# Patient Record
Sex: Female | Born: 1970 | Race: White | Hispanic: No | Marital: Married
Health system: Southern US, Community
[De-identification: ages and names within clinical notes are randomized; demographics above are authoritative.]

---

## 1998-02-09 ENCOUNTER — Ambulatory Visit (HOSPITAL_COMMUNITY): Admission: RE | Admit: 1998-02-09 | Discharge: 1998-02-09 | Payer: Self-pay | Admitting: Obstetrics and Gynecology

## 1998-03-01 ENCOUNTER — Other Ambulatory Visit: Admission: RE | Admit: 1998-03-01 | Discharge: 1998-03-01 | Payer: Self-pay | Admitting: Obstetrics and Gynecology

## 1998-04-12 ENCOUNTER — Ambulatory Visit (HOSPITAL_COMMUNITY): Admission: RE | Admit: 1998-04-12 | Discharge: 1998-04-12 | Payer: Self-pay | Admitting: Obstetrics and Gynecology

## 1998-06-18 ENCOUNTER — Other Ambulatory Visit: Admission: RE | Admit: 1998-06-18 | Discharge: 1998-06-18 | Payer: Self-pay | Admitting: Obstetrics and Gynecology

## 1998-08-26 ENCOUNTER — Inpatient Hospital Stay (HOSPITAL_COMMUNITY): Admission: AD | Admit: 1998-08-26 | Discharge: 1998-08-29 | Payer: Self-pay | Admitting: Obstetrics & Gynecology

## 2001-09-16 ENCOUNTER — Other Ambulatory Visit: Admission: RE | Admit: 2001-09-16 | Discharge: 2001-09-16 | Payer: Self-pay | Admitting: Obstetrics and Gynecology

## 2001-11-30 ENCOUNTER — Encounter: Payer: Self-pay | Admitting: Obstetrics and Gynecology

## 2001-11-30 ENCOUNTER — Ambulatory Visit (HOSPITAL_COMMUNITY): Admission: RE | Admit: 2001-11-30 | Discharge: 2001-11-30 | Payer: Self-pay | Admitting: Obstetrics and Gynecology

## 2002-03-30 ENCOUNTER — Inpatient Hospital Stay (HOSPITAL_COMMUNITY): Admission: AD | Admit: 2002-03-30 | Discharge: 2002-03-30 | Payer: Self-pay | Admitting: Obstetrics and Gynecology

## 2002-03-30 ENCOUNTER — Inpatient Hospital Stay (HOSPITAL_COMMUNITY): Admission: AD | Admit: 2002-03-30 | Discharge: 2002-04-01 | Payer: Self-pay | Admitting: Obstetrics and Gynecology

## 2006-02-09 ENCOUNTER — Inpatient Hospital Stay (HOSPITAL_COMMUNITY): Admission: AD | Admit: 2006-02-09 | Discharge: 2006-02-11 | Payer: Self-pay | Admitting: Obstetrics and Gynecology

## 2018-01-19 ENCOUNTER — Other Ambulatory Visit: Payer: Self-pay | Admitting: Obstetrics and Gynecology

## 2018-01-19 DIAGNOSIS — N6320 Unspecified lump in the left breast, unspecified quadrant: Secondary | ICD-10-CM

## 2018-01-22 ENCOUNTER — Ambulatory Visit
Admission: RE | Admit: 2018-01-22 | Discharge: 2018-01-22 | Disposition: A | Discharge status: Home / Self Care | Payer: BLUE CROSS/BLUE SHIELD | Source: Ambulatory Visit | Attending: Obstetrics and Gynecology | Admitting: Obstetrics and Gynecology

## 2018-01-22 DIAGNOSIS — N6320 Unspecified lump in the left breast, unspecified quadrant: Secondary | ICD-10-CM

## 2019-05-26 ENCOUNTER — Other Ambulatory Visit: Payer: Self-pay | Admitting: Obstetrics and Gynecology

## 2019-05-26 DIAGNOSIS — N6310 Unspecified lump in the right breast, unspecified quadrant: Secondary | ICD-10-CM

## 2019-06-02 ENCOUNTER — Ambulatory Visit
Admission: RE | Admit: 2019-06-02 | Discharge: 2019-06-02 | Disposition: A | Discharge status: Home / Self Care | Payer: BC Managed Care – PPO | Source: Ambulatory Visit | Attending: Obstetrics and Gynecology | Admitting: Obstetrics and Gynecology

## 2019-06-02 ENCOUNTER — Ambulatory Visit
Admission: RE | Admit: 2019-06-02 | Discharge: 2019-06-02 | Disposition: A | Discharge status: Home / Self Care | Payer: BLUE CROSS/BLUE SHIELD | Source: Ambulatory Visit | Attending: Obstetrics and Gynecology | Admitting: Obstetrics and Gynecology

## 2019-06-02 ENCOUNTER — Other Ambulatory Visit: Payer: Self-pay

## 2019-06-02 DIAGNOSIS — N6310 Unspecified lump in the right breast, unspecified quadrant: Secondary | ICD-10-CM

## 2019-12-25 IMAGING — MG DIGITAL DIAGNOSTIC UNILATERAL RIGHT MAMMOGRAM WITH TOMO AND CAD
4 series · 4 of 12 positions shown · non-contrast
Comparison: Previous exam(s).

CLINICAL DATA: Possible right breast mass seen on most recent
screening mammography.

EXAM:
DIGITAL DIAGNOSTIC RIGHT MAMMOGRAM WITH CAD AND TOMO
ULTRASOUND RIGHT BREAST

[R CC synth-2D]
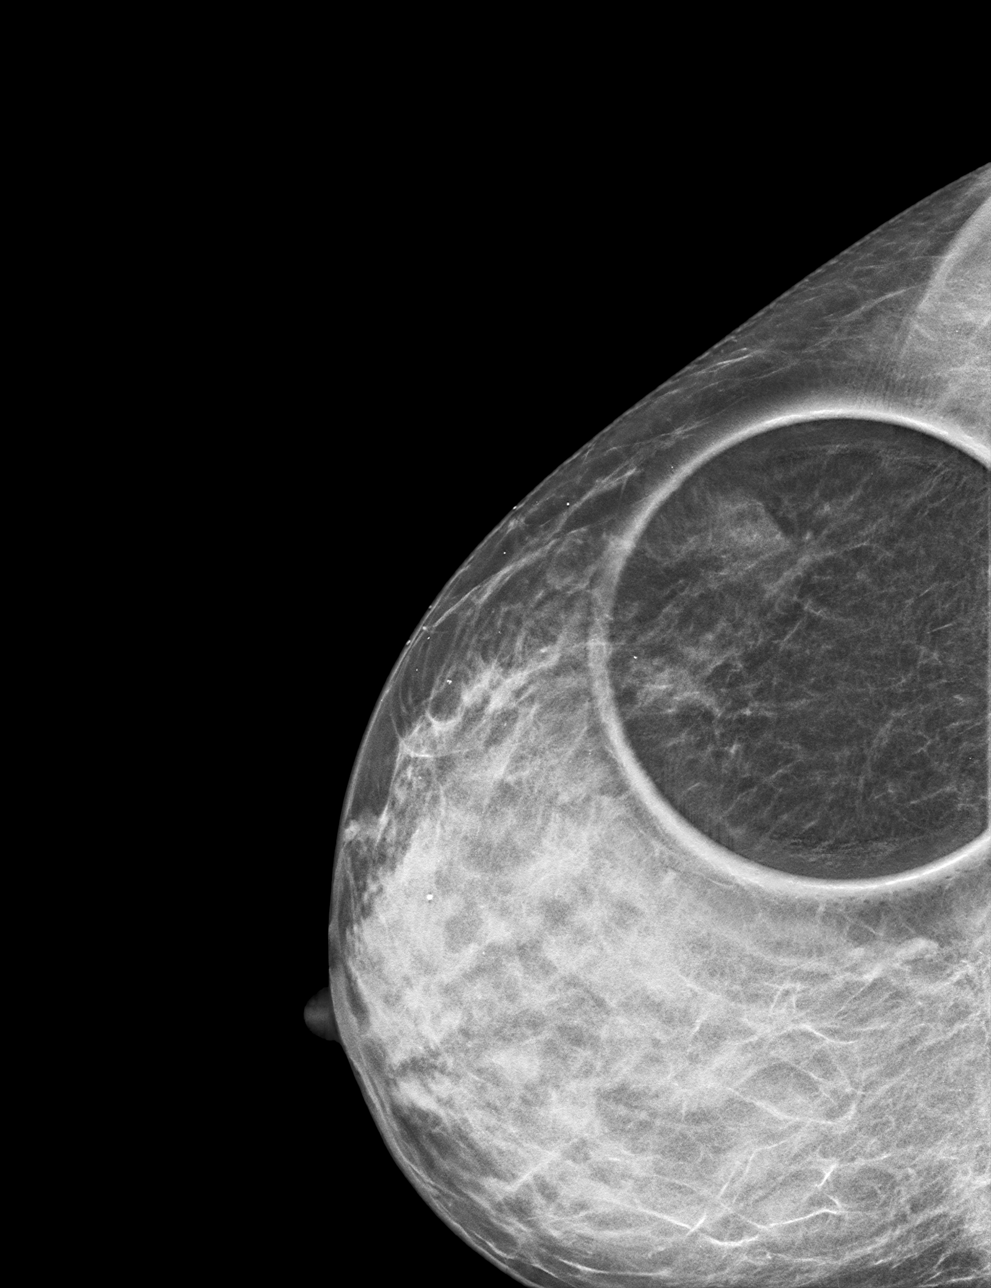

[R MLO synth-2D]
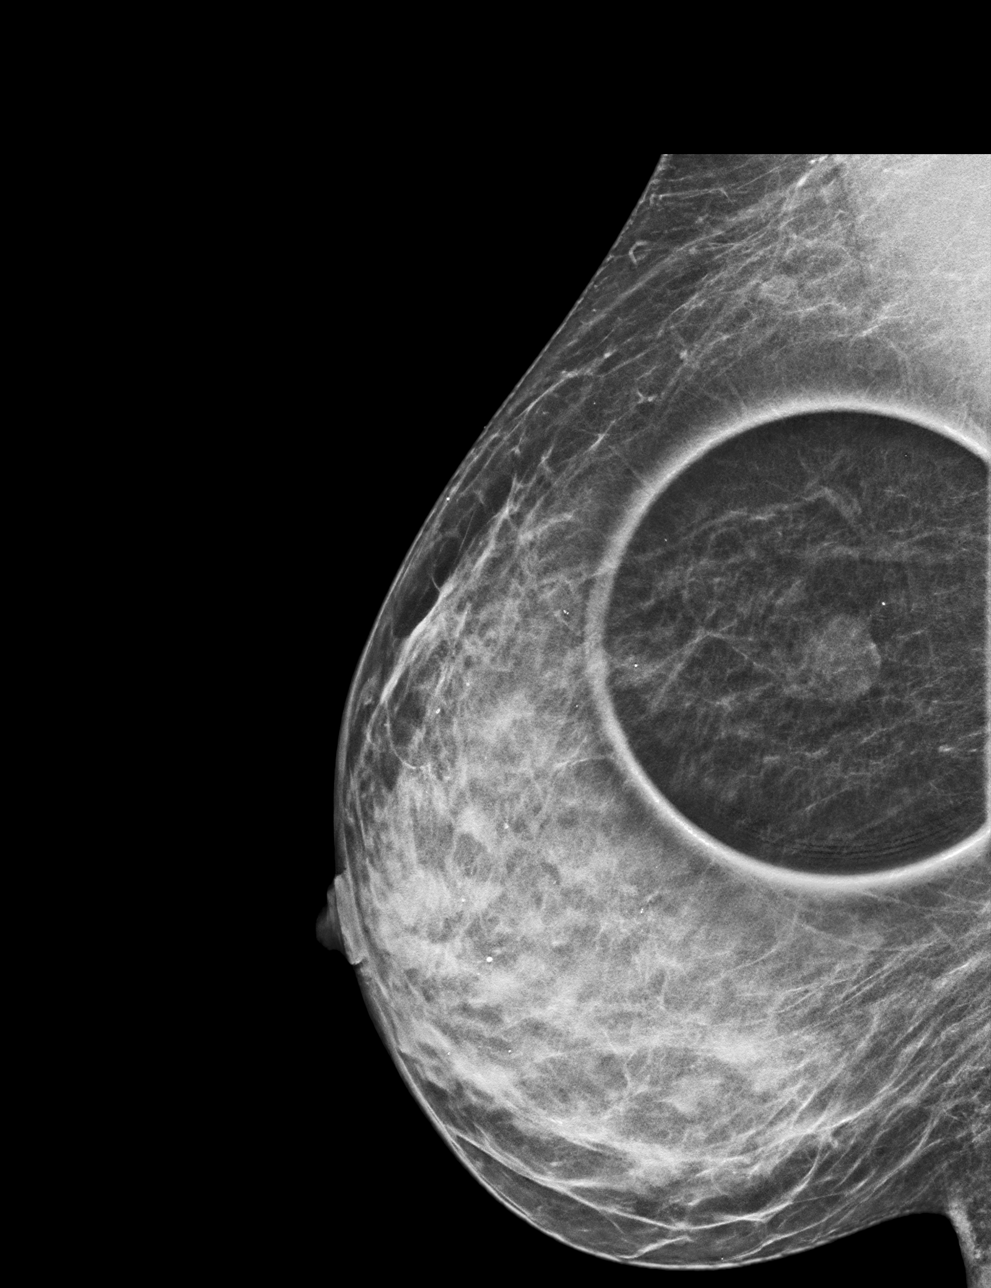

[R MLO tomo · tomo slice 23/45.0]
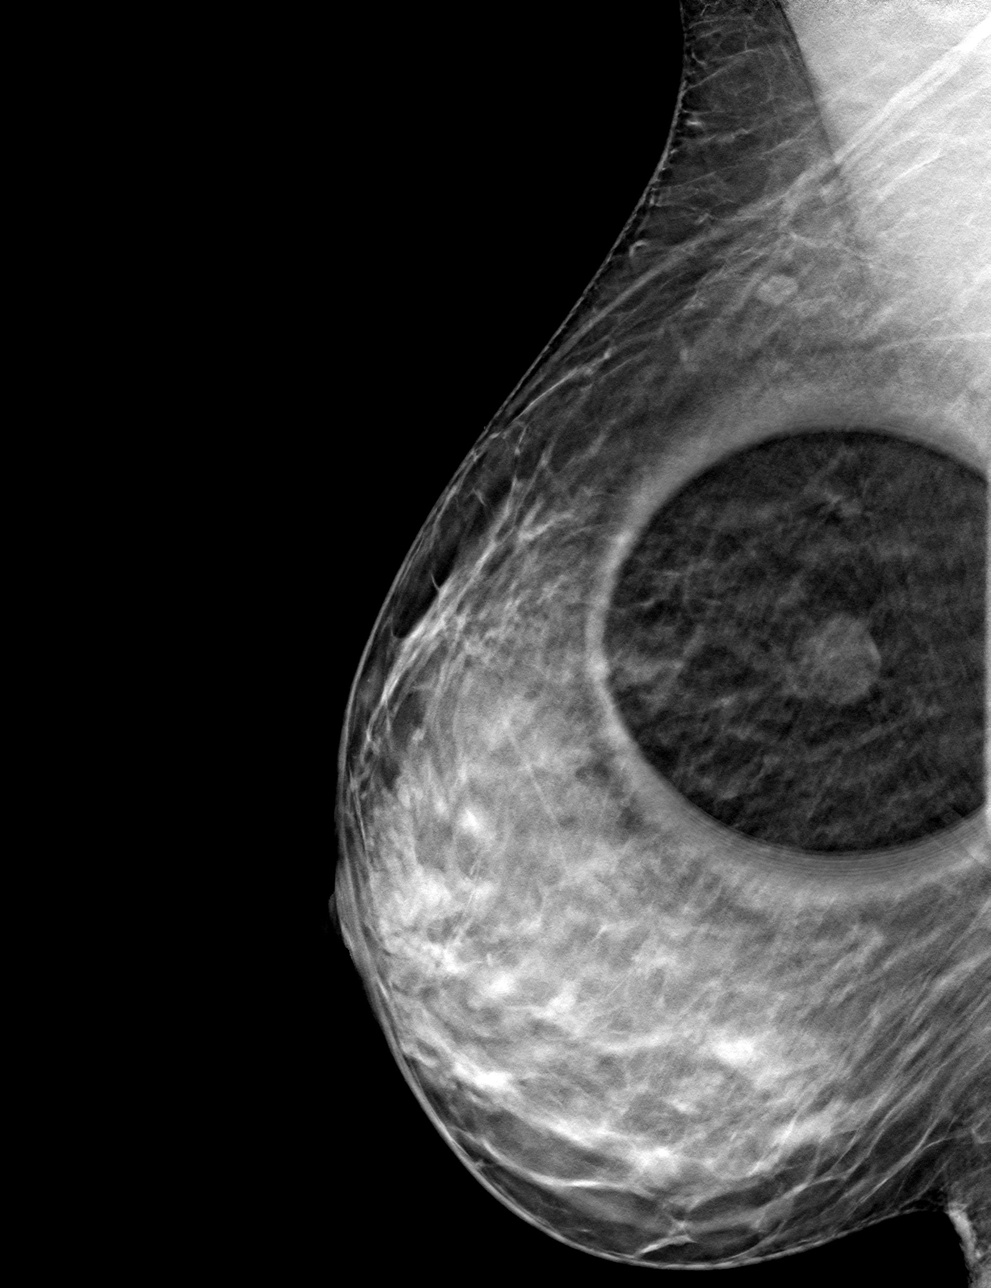

[R CC tomo · tomo slice 22/43.0]
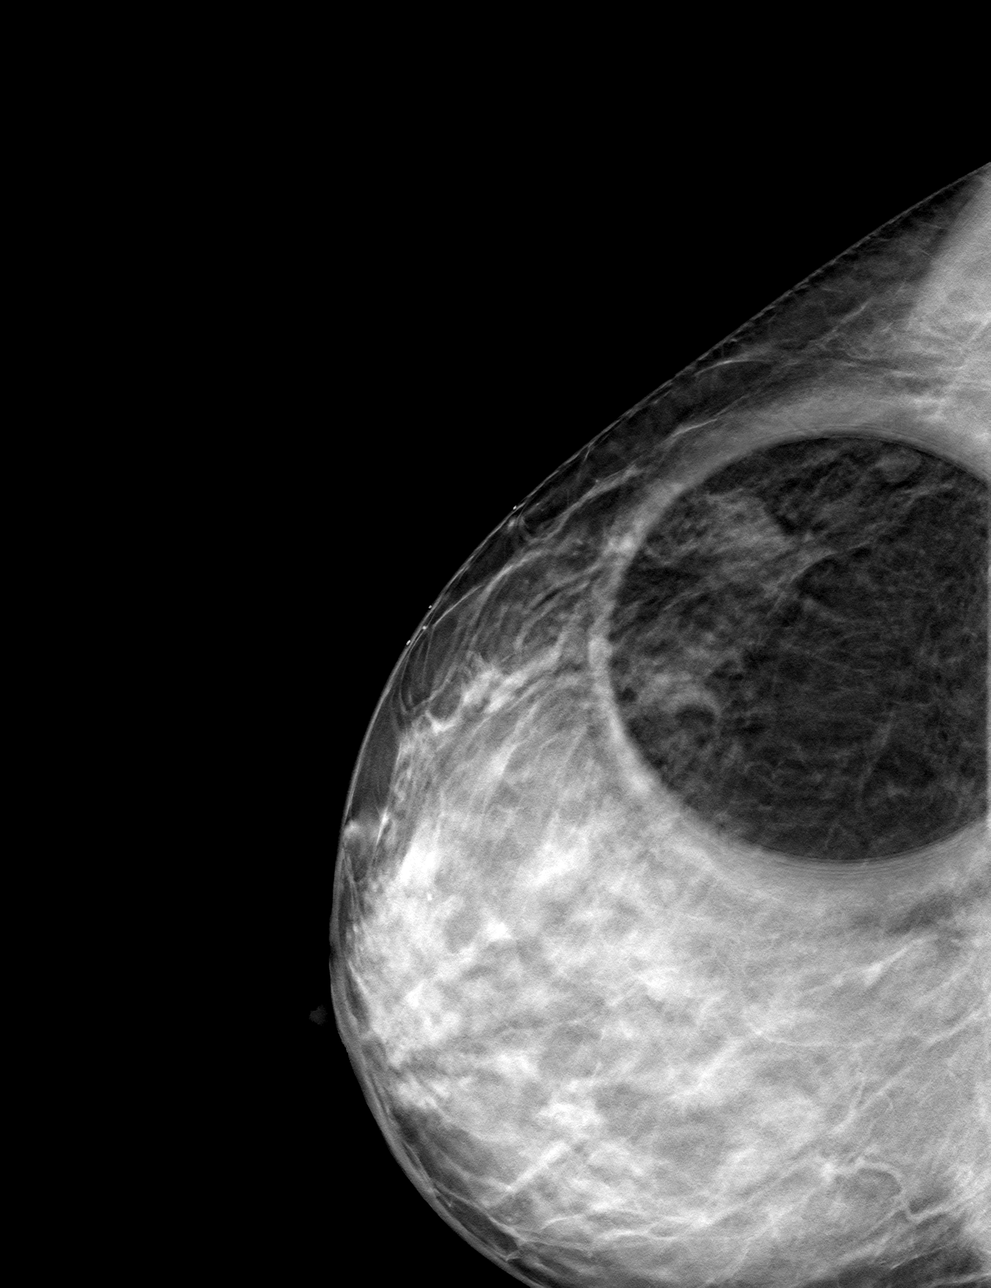

[4 of 12 positions shown; findings below may reference images not displayed]

ACR Breast Density Category c: The breast tissue is heterogeneously
dense, which may obscure small masses.
FINDINGS: Additional mammographic views of the right breast demonstrate
circumscribed 1.4 cm mass in the right breast slightly upper outer
quadrant, posterior depth.

Mammographic images were processed with CAD.

Targeted ultrasound is performed, showing right breast 9:30 o'clock
8 cm from the nipple benign-appearing cyst measuring 1.3 x 1.5 x
cm. This finding corresponds to the mammographically seen mass.
IMPRESSION: Right breast 9:30 o'clock benign-appearing cyst.

No mammographic evidence of malignancy in the right breast.

RECOMMENDATION:
Screening mammogram in one year.(Code:[id number])

I have discussed the findings and recommendations with the patient.
Results were also provided in writing at the conclusion of the
visit. If applicable, a reminder letter will be sent to the patient
regarding the next appointment.

BI-RADS CATEGORY  2: Benign.

## 2023-07-12 ENCOUNTER — Other Ambulatory Visit: Payer: Self-pay

## 2023-07-12 ENCOUNTER — Emergency Department (HOSPITAL_COMMUNITY)
Admission: EM | Admit: 2023-07-12 | Discharge: 2023-07-12 | Disposition: A | Discharge status: Home / Self Care | Payer: 59 | Attending: Emergency Medicine | Admitting: Emergency Medicine

## 2023-07-12 ENCOUNTER — Emergency Department (HOSPITAL_COMMUNITY): Payer: 59

## 2023-07-12 ENCOUNTER — Encounter (HOSPITAL_COMMUNITY): Payer: Self-pay

## 2023-07-12 DIAGNOSIS — S0181XA Laceration without foreign body of other part of head, initial encounter: Secondary | ICD-10-CM

## 2023-07-12 DIAGNOSIS — Z23 Encounter for immunization: Secondary | ICD-10-CM | POA: Diagnosis not present

## 2023-07-12 DIAGNOSIS — R55 Syncope and collapse: Secondary | ICD-10-CM | POA: Diagnosis not present

## 2023-07-12 DIAGNOSIS — W1830XA Fall on same level, unspecified, initial encounter: Secondary | ICD-10-CM | POA: Diagnosis not present

## 2023-07-12 LAB — CBC WITH DIFFERENTIAL/PLATELET
Abs Immature Granulocytes: 0.04 10*3/uL (ref 0.00–0.07)
Basophils Absolute: 0.1 10*3/uL (ref 0.0–0.1)
Basophils Relative: 1 %
Eosinophils Absolute: 0.2 10*3/uL (ref 0.0–0.5)
Eosinophils Relative: 2 %
HCT: 42.1 % (ref 36.0–46.0)
Hemoglobin: 13.9 g/dL (ref 12.0–15.0)
Immature Granulocytes: 0 %
Lymphocytes Relative: 33 %
Lymphs Abs: 3.7 10*3/uL (ref 0.7–4.0)
MCH: 32.7 pg (ref 26.0–34.0)
MCHC: 33 g/dL (ref 30.0–36.0)
MCV: 99.1 fL (ref 80.0–100.0)
Monocytes Absolute: 0.6 10*3/uL (ref 0.1–1.0)
Monocytes Relative: 5 %
Neutro Abs: 6.6 10*3/uL (ref 1.7–7.7)
Neutrophils Relative %: 59 %
Platelets: 309 10*3/uL (ref 150–400)
RBC: 4.25 MIL/uL (ref 3.87–5.11)
RDW: 13.1 % (ref 11.5–15.5)
WBC: 11.2 10*3/uL — ABNORMAL HIGH (ref 4.0–10.5)
nRBC: 0 % (ref 0.0–0.2)

## 2023-07-12 LAB — HCG, SERUM, QUALITATIVE: Preg, Serum: NEGATIVE

## 2023-07-12 LAB — CBG MONITORING, ED: Glucose-Capillary: 104 mg/dL — ABNORMAL HIGH (ref 70–99)

## 2023-07-12 MED ORDER — LIDOCAINE-EPINEPHRINE (PF) 2 %-1:200000 IJ SOLN
10.0000 mL | Freq: Once | INTRAMUSCULAR | Status: AC
  Administered 2023-07-12: 10 mL
  Filled 2023-07-12: qty 20

## 2023-07-12 MED ORDER — SODIUM CHLORIDE 0.9 % IV SOLN: INTRAVENOUS | Status: DC

## 2023-07-12 MED ORDER — SODIUM CHLORIDE 0.9 % IV BOLUS
1000.0000 mL | Freq: Once | INTRAVENOUS | Status: AC
  Administered 2023-07-12: 1000 mL via INTRAVENOUS

## 2023-07-12 MED ORDER — BACITRACIN ZINC 500 UNIT/GM EX OINT
TOPICAL_OINTMENT | Freq: Two times a day (BID) | CUTANEOUS | Status: DC
  Filled 2023-07-12: qty 0.9

## 2023-07-12 MED ORDER — LIDOCAINE-EPINEPHRINE-TETRACAINE (LET) TOPICAL GEL
3.0000 mL | Freq: Once | TOPICAL | Status: AC
  Administered 2023-07-12: 3 mL via TOPICAL
  Filled 2023-07-12: qty 3

## 2023-07-12 MED ORDER — KETOROLAC TROMETHAMINE 30 MG/ML IJ SOLN
30.0000 mg | Freq: Once | INTRAMUSCULAR | Status: AC
  Administered 2023-07-12: 30 mg via INTRAVENOUS
  Filled 2023-07-12: qty 1

## 2023-07-12 MED ORDER — TETANUS-DIPHTH-ACELL PERTUSSIS 5-2.5-18.5 LF-MCG/0.5 IM SUSY
0.5000 mL | PREFILLED_SYRINGE | Freq: Once | INTRAMUSCULAR | Status: AC
  Administered 2023-07-12: 0.5 mL via INTRAMUSCULAR
  Filled 2023-07-12: qty 0.5
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
## 2023-07-12 NOTE — Discharge Instructions (Addendum)
Sutures need to be removed in 5 to 7 days.

## 2023-07-12 NOTE — ED Triage Notes (Signed)
BIB EMS from [facility] for syncopal episode. Pt fell face first, has 1 inch laceration to right side of eye. Bleeding controlled with EMS. Pt A&Ox4 on arrival.
